# Patient Record
Sex: Male | Born: 2000 | Race: White | Hispanic: Yes | Marital: Single | Smoking: Never smoker
Health system: Southern US, Community
[De-identification: ages and names within clinical notes are randomized; demographics above are authoritative.]

## PROBLEM LIST (undated history)

## (undated) HISTORY — PX: SKIN TAG REMOVAL: SHX780

---
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
## 2023-11-02 ENCOUNTER — Emergency Department (HOSPITAL_COMMUNITY)

## 2023-11-02 ENCOUNTER — Other Ambulatory Visit: Payer: Self-pay

## 2023-11-02 ENCOUNTER — Encounter (HOSPITAL_COMMUNITY): Payer: Self-pay | Admitting: *Deleted

## 2023-11-02 ENCOUNTER — Emergency Department (HOSPITAL_COMMUNITY)
Admission: EM | Admit: 2023-11-02 | Discharge: 2023-11-03 | Disposition: A | Discharge status: Home / Self Care | Attending: Emergency Medicine | Admitting: Emergency Medicine

## 2023-11-02 DIAGNOSIS — S83411A Sprain of medial collateral ligament of right knee, initial encounter: Secondary | ICD-10-CM | POA: Diagnosis not present

## 2023-11-02 DIAGNOSIS — R102 Pelvic and perineal pain unspecified side: Secondary | ICD-10-CM | POA: Diagnosis not present

## 2023-11-02 DIAGNOSIS — R109 Unspecified abdominal pain: Secondary | ICD-10-CM | POA: Diagnosis not present

## 2023-11-02 DIAGNOSIS — Y9351 Activity, roller skating (inline) and skateboarding: Secondary | ICD-10-CM | POA: Insufficient documentation

## 2023-11-02 DIAGNOSIS — M25572 Pain in left ankle and joints of left foot: Secondary | ICD-10-CM | POA: Insufficient documentation

## 2023-11-02 DIAGNOSIS — S3991XA Unspecified injury of abdomen, initial encounter: Secondary | ICD-10-CM | POA: Diagnosis not present

## 2023-11-02 DIAGNOSIS — S8991XA Unspecified injury of right lower leg, initial encounter: Secondary | ICD-10-CM | POA: Diagnosis present

## 2023-11-02 DIAGNOSIS — Z043 Encounter for examination and observation following other accident: Secondary | ICD-10-CM | POA: Diagnosis not present

## 2023-11-02 MED ORDER — OXYCODONE-ACETAMINOPHEN 5-325 MG PO TABS
1.0000 | ORAL_TABLET | Freq: Once | ORAL | Status: AC
  Administered 2023-11-02: 1 via ORAL
  Filled 2023-11-02: qty 1

## 2023-11-02 NOTE — ED Provider Triage Note (Signed)
 Emergency Medicine Provider Triage Evaluation Note  Jeffrey Hays , a 23 y.o. male  was evaluated in triage.  Pt complains of fall. Was skate boarding today and loss control, fell and injured L knee and L ankle.  Did not hit head of LOC.  Felt multiple pops  Review of Systems  Positive: As above Negative: As above  Physical Exam  BP 131/75 (BP Location: Left Arm)   Pulse 100   Temp 99.7 F (37.6 C)   Resp 18   SpO2 95%  Gen:   Awake, no distress   Resp:  Normal effort  MSK:   Moves extremities without difficulty  Other:  Ttp L knee and L ankle  Medical Decision Making  Medically screening exam initiated at 7:00 PM.  Appropriate orders placed.  Jeffrey Hays was informed that the remainder of the evaluation will be completed by another provider, this initial triage assessment does not replace that evaluation, and the importance of remaining in the ED until their evaluation is complete.     Nivia Colon, PA-C 11/02/23 [id number]

## 2023-11-02 NOTE — ED Triage Notes (Signed)
 Pt c/o pain to L knee and L ankle after falling from skateboard; did not hit head; pt ambulatory  but endorses pain when bearing weight

## 2023-11-02 NOTE — ED Triage Notes (Signed)
 Pt is here for knee and ankle pain since skateboarding accident

## 2023-11-03 ENCOUNTER — Emergency Department (HOSPITAL_COMMUNITY)

## 2023-11-03 DIAGNOSIS — S3991XA Unspecified injury of abdomen, initial encounter: Secondary | ICD-10-CM | POA: Diagnosis not present

## 2023-11-03 DIAGNOSIS — R102 Pelvic and perineal pain unspecified side: Secondary | ICD-10-CM | POA: Diagnosis not present

## 2023-11-03 LAB — I-STAT CHEM 8, ED
BUN: 12 mg/dL (ref 6–20)
Calcium, Ion: 1.2 mmol/L (ref 1.15–1.40)
Chloride: 100 mmol/L (ref 98–111)
Creatinine, Ser: 1.1 mg/dL (ref 0.61–1.24)
Glucose, Bld: 121 mg/dL — ABNORMAL HIGH (ref 70–99)
HCT: 42 % (ref 39.0–52.0)
Hemoglobin: 14.3 g/dL (ref 13.0–17.0)
Potassium: 3.2 mmol/L — ABNORMAL LOW (ref 3.5–5.1)
Sodium: 140 mmol/L (ref 135–145)
TCO2: 27 mmol/L (ref 22–32)

## 2023-11-03 MED ORDER — IOHEXOL 350 MG/ML SOLN
75.0000 mL | Freq: Once | INTRAVENOUS | Status: AC | PRN
  Administered 2023-11-03: 75 mL via INTRAVENOUS

## 2023-11-03 MED ORDER — HYDROCODONE-ACETAMINOPHEN 5-325 MG PO TABS: 1.0000 | ORAL_TABLET | ORAL | 0 refills | Status: AC | PRN

## 2023-11-03 MED ORDER — ONDANSETRON HCL 4 MG/2ML IJ SOLN
4.0000 mg | Freq: Once | INTRAMUSCULAR | Status: AC
  Administered 2023-11-03: 4 mg via INTRAVENOUS
  Filled 2023-11-03: qty 2

## 2023-11-03 MED ORDER — MORPHINE SULFATE (PF) 4 MG/ML IV SOLN
4.0000 mg | Freq: Once | INTRAVENOUS | Status: AC
  Administered 2023-11-03: 4 mg via INTRAVENOUS
  Filled 2023-11-03: qty 1

## 2023-11-03 NOTE — Progress Notes (Signed)
 Orthopedic Tech Progress Note Patient Details:  Jeffrey Hays [birth year] [id number] Applied knee immobilizer per order. Obtained product from ortho tech office, as there were none in the ED supply.  Ortho Devices Type of Ortho Device: Knee Immobilizer Ortho Device/Splint Location: LLE Ortho Device/Splint Interventions: Ordered, Application, Adjustment   Post Interventions Patient Tolerated: Well Instructions Provided: Adjustment of device, Poper ambulation with device, Care of device  Morna Pink 11/03/2023, 6:21 AM

## 2023-11-03 NOTE — ED Provider Notes (Signed)
 [place] EMERGENCY DEPARTMENT AT [facility] Provider Note   CSN: [id number] Arrival date & time: 11/02/23  8161     Patient presents with: Knee Pain and Ankle Pain   Jeffrey Hays is a 23 y.o. male.   Patient presents to the emergency department for evaluation of left leg injury.  Patient reports that he was skateboarding when he injured his leg.  He reports that he caught the toes of his left foot on a ramp and it twisted his leg outwards.  He reports that the entire leg bent into a V.  He has not been able to bear weight since the injury.       Prior to Admission medications   Medication Sig Start Date End Date Taking? Authorizing Provider  HYDROcodone-acetaminophen  (NORCO/VICODIN) 5-325 MG tablet Take 1 tablet by mouth every 4 (four) hours as needed for moderate pain (pain score 4-6). 11/03/23  Yes Phoenix Dresser, Lonni PARAS, MD    Allergies: Patient has no known allergies.    Review of Systems  Updated Vital Signs BP 103/64   Pulse (!) 51   Temp 98.9 F (37.2 C)   Resp 18   SpO2 100%   Physical Exam Vitals and nursing note reviewed.  Constitutional:      General: He is not in acute distress.    Appearance: He is well-developed.  HENT:     Head: Normocephalic and atraumatic.     Mouth/Throat:     Mouth: Mucous membranes are moist.  Eyes:     General: Vision grossly intact. Gaze aligned appropriately.     Extraocular Movements: Extraocular movements intact.     Conjunctiva/sclera: Conjunctivae normal.  Cardiovascular:     Rate and Rhythm: Normal rate and regular rhythm.     Pulses:          Dorsalis pedis pulses are 1+ on the left side.     Heart sounds: Normal heart sounds, S1 normal and S2 normal. No murmur heard.    No friction rub. No gallop.  Pulmonary:     Effort: Pulmonary effort is normal. No respiratory distress.     Breath sounds: Normal breath sounds.  Abdominal:     Palpations: Abdomen is soft.     Tenderness: There is no abdominal  tenderness. There is no guarding or rebound.     Hernia: No hernia is present.  Musculoskeletal:        General: No swelling.     Cervical back: Full passive range of motion without pain, normal range of motion and neck supple. No pain with movement, spinous process tenderness or muscular tenderness. Normal range of motion.     Left knee: No swelling, deformity, effusion, erythema or ecchymosis. Decreased range of motion. Tenderness present over the medial joint line. No LCL laxity, MCL laxity, ACL laxity or PCL laxity.Normal pulse.     Right lower leg: No edema.     Left lower leg: No edema.     Left ankle: Swelling present. No deformity or ecchymosis. Tenderness present. Decreased range of motion.  Skin:    General: Skin is warm and dry.     Capillary Refill: Capillary refill takes less than 2 seconds.     Findings: No ecchymosis, erythema, lesion or wound.  Neurological:     Mental Status: He is alert and oriented to person, place, and time.     GCS: GCS eye subscore is 4. GCS verbal subscore is 5. GCS motor subscore is 6.  Cranial Nerves: Cranial nerves 2-12 are intact.     Sensory: Sensation is intact.     Motor: Motor function is intact. No weakness or abnormal muscle tone.     Coordination: Coordination is intact.  Psychiatric:        Mood and Affect: Mood normal.        Speech: Speech normal.        Behavior: Behavior normal.     (all labs ordered are listed, but only abnormal results are displayed) Labs Reviewed  I-STAT CHEM 8, ED - Abnormal; Notable for the following components:      Result Value   Potassium 3.2 (*)    Glucose, Bld 121 (*)    All other components within normal limits    EKG: None  Radiology: CT ANGIO LOWER EXT BILAT W &/OR WO CONTRAST Result Date: 11/03/2023 CLINICAL DATA:  23 year old male status post skateboard injury, pain, suspected knee dislocation. EXAM: CT ANGIOGRAPHY OF ABDOMINAL AORTA WITH ILIOFEMORAL RUNOFF TECHNIQUE: Multidetector CT  imaging of the abdomen, pelvis and lower extremities was performed using the standard protocol during bolus administration of intravenous contrast. Multiplanar CT image reconstructions and MIPs were obtained to evaluate the vascular anatomy. RADIATION DOSE REDUCTION: This exam was performed according to the departmental dose-optimization program which includes automated exposure control, adjustment of the mA and/or kV according to patient size and/or use of iterative reconstruction technique. CONTRAST:  75mL OMNIPAQUE IOHEXOL 350 MG/ML SOLN COMPARISON:  Left knee and ankle radiographs last night. FINDINGS: VASCULAR Arterial and portal venous phase images of the abdomen, pelvis, bilateral lower extremities. Aorta: Mild motion artifact in the abdomen on the arterial phase images. The abdominal aorta appears intact and normal. Celiac: Patent and within normal limits, mild motion artifact. SMA: Patent and within normal limits, mild motion artifact. Renals: Patent, duplicated renal arteries, normal variant. IMA: Patent, normal. Iliac arteries: Bilateral internal and external iliac arteries appear patent and normal. RIGHT Lower Extremity Inflow: Patent and normal. Outflow: Patent and normal. Runoff: Normal three-vessel runoff. Veins: Enhancing and within normal limits. LEFT Lower Extremity Inflow: Patent and normal. Outflow: Patent and normal, including the distal SFA and popliteal arteries. And no arterial contrast extravasation identified at the knee. Runoff: Patent and normal three-vessel runoff, symmetric to the opposite side. Veins: Enhancing and within normal limits. Portal and systemic Veins: Portal venous system appears patent on the delayed phase images. IVC and central venous structures of the abdomen and pelvis appear patent on the delayed phase images. Review of the MIP images confirms the above findings. NON-VASCULAR Lower chest: Normal visible heart size, no pericardial or pleural effusion. Mild respiratory  motion artifact at the lung bases which otherwise appear negative. Hepatobiliary: Liver appears intact, negative. Partially contracted gallbladder. No perihepatic fluid identified. Pancreas: Negative. Spleen: Spleen appears intact. No perisplenic fluid. Small splenule, normal variant. Adrenals/Urinary Tract: Normal adrenal glands. Symmetric renal enhancement and contrast excretion on the more delayed phase images. Left renal upper pole small benign cortical cyst (no follow-up imaging recommended). Diminutive ureters. Moderately distended but otherwise unremarkable urinary bladder. Stomach/Bowel: Nondilated large and small bowel loops. Paucity of visceral fat. Large bowel retained gas and stool incidentally noted. No pneumoperitoneum or free fluid identified. Small volume retained fluid in the stomach. Duodenum is decompressed. Evidence of normal gas containing appendix on series 11, image 125. Lymphatic: No lymphadenopathy. Reproductive: Negative. Other: No pelvis free fluid. Musculoskeletal: Visible lower ribs appear intact. Lumbar vertebrae, sacrum, SI joints, pelvis, and proximal femurs appear intact and aligned.  Intact right femur. Maintained alignment at the right knee. Right patella intact. No knee joint effusion identified. Right tib fib intact. Maintained alignment at the right ankle. No fracture or dislocation identified in the right foot. Left femur intact. Small suprapatellar left knee joint effusion with mildly complicated fluid density, probably hemarthrosis. Patella intact. No soft tissue gas identified. No intramuscular hematoma is evident. Alignment at the left knee is maintained. Distal femur, left tibial plateau and proximal left fibula appear intact. Benign appearing cortical defect of the medial proximal left tibia metadiaphysis redemonstrated, strongly favor benign etiology such as ossifying fibroma (series 10, image 73). Otherwise proximal tibia bone mineralization is normal, no fracture or  periosteal reaction. Elsewhere the left tib fib appear intact. Maintained alignment at the left ankle. No ankle joint effusion identified. Calcaneus and other left foot osseous structures appear intact and aligned. IMPRESSION: VASCULAR No arterial or venous vascular injury is identified in the abdomen, pelvis, or bilateral lower extremities. NON-VASCULAR 1. Small volume Left Knee Hemarthrosis. No fracture or dislocation is identified. 2. 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 3. No acute traumatic injury identified in the abdomen or pelvis. Electronically Signed   By: VEAR Hurst M.D.   On: 11/03/2023 05:29   DG Ankle Complete Left Result Date: 11/02/2023 CLINICAL DATA:  Fall while skateboarding. EXAM: LEFT KNEE - COMPLETE 4+ VIEW; LEFT ANKLE COMPLETE - 3+ VIEW COMPARISON:  None available FINDINGS: LEFT KNEE: No fracture, dislocation, or soft tissue abnormality. LEFT ANKLE: No fracture, dislocation, or soft tissue abnormality. IMPRESSION: No acute abnormality of the LEFT knee or ankle. Electronically Signed   By: Aliene Lloyd M.D.   On: 11/02/2023 19:54   DG Knee Complete 4 Views Left Result Date: 11/02/2023 CLINICAL DATA:  Fall while skateboarding. EXAM: LEFT KNEE - COMPLETE 4+ VIEW; LEFT ANKLE COMPLETE - 3+ VIEW COMPARISON:  None available FINDINGS: LEFT KNEE: No fracture, dislocation, or soft tissue abnormality. LEFT ANKLE: No fracture, dislocation, or soft tissue abnormality. IMPRESSION: No acute abnormality of the LEFT knee or ankle. Electronically Signed   By: Aliene Lloyd M.D.   On: 11/02/2023 19:54     Procedures   Medications Ordered in the ED  oxyCODONE-acetaminophen  (PERCOCET/ROXICET) 5-325 MG per tablet 1 tablet (1 tablet Oral Given 11/02/23 1904)  iohexol (OMNIPAQUE) 350 MG/ML injection 75 mL (75 mLs Intravenous Contrast Given 11/03/23 0455)  morphine (PF) 4 MG/ML injection 4 mg (4 mg Intravenous Given 11/03/23 0549)   ondansetron  (ZOFRAN ) injection 4 mg (4 mg Intravenous Given 11/03/23 0548)                                    Medical Decision Making Amount and/or Complexity of Data Reviewed Radiology: ordered.  Risk Prescription drug management.   Differential diagnosis considered includes, but not limited to: Fracture; knee sprain; internal derangement of knee including knee dislocation  Presents to the emergency department for evaluation of knee injury that occurred while skateboarding.  Patient reports that his toe caught a ramp and it twisted his leg laterally.  Patient reports that he fell to the ground and his knee was shaped like a V.  Here in the ED, there is no obvious dislocation.  No deformities noted.  No significant swelling of the knee.  X-ray of knee and ankle are negative.  Patient's description, however, raises concern for possible spontaneously reduced knee dislocation.  Another  consideration would be patella dislocation.  Do not appreciate any significant ligamentous laxity but he does report valgus displacement of the leg when the injury occurred.  He does have a good amount of tenderness over the medial joint line, concern for at least MCL injury.  Patient underwent CT angiography to determine if there was any vascular injury.  No vascular injury noted.  Small joint effusion present but no other bony injuries noted.  Will place in knee immobilizer, provide analgesia, follow-up with orthopedics to determine if there is any internal injury.     Final diagnoses:  Sprain of medial collateral ligament of right knee, initial encounter    ED Discharge Orders          Ordered    HYDROcodone-acetaminophen  (NORCO/VICODIN) 5-325 MG tablet  Every 4 hours PRN        11/03/23 0558               Haze Lonni PARAS, MD 11/03/23 [phone]

## 2023-11-17 DIAGNOSIS — S83512A Sprain of anterior cruciate ligament of left knee, initial encounter: Secondary | ICD-10-CM | POA: Diagnosis not present

## 2023-11-22 ENCOUNTER — Other Ambulatory Visit (HOSPITAL_COMMUNITY): Payer: Self-pay | Admitting: Orthopedic Surgery

## 2023-11-22 DIAGNOSIS — M25562 Pain in left knee: Secondary | ICD-10-CM

## 2023-12-03 ENCOUNTER — Encounter (HOSPITAL_COMMUNITY): Payer: Self-pay

## 2023-12-03 ENCOUNTER — Ambulatory Visit (HOSPITAL_COMMUNITY)
